# Patient Record
Sex: Male | Born: 2000
Health system: Southern US, Community
[De-identification: ages and names within clinical notes are randomized; demographics above are authoritative.]

## PROBLEM LIST (undated history)

## (undated) DIAGNOSIS — Z8701 Personal history of pneumonia (recurrent): Secondary | ICD-10-CM

## (undated) HISTORY — PX: EYE SURGERY: SHX253

---
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
## 2021-01-02 ENCOUNTER — Other Ambulatory Visit: Payer: Self-pay

## 2021-01-02 ENCOUNTER — Emergency Department (INDEPENDENT_AMBULATORY_CARE_PROVIDER_SITE_OTHER)
Admission: EM | Admit: 2021-01-02 | Discharge: 2021-01-02 | Disposition: A | Discharge status: Home / Self Care | Payer: BC Managed Care – PPO | Source: Home / Self Care | Attending: Family Medicine | Admitting: Family Medicine

## 2021-01-02 ENCOUNTER — Encounter: Payer: Self-pay | Admitting: *Deleted

## 2021-01-02 ENCOUNTER — Emergency Department: Admit: 2021-01-02 | Discharge status: Absent without leave | Payer: Self-pay

## 2021-01-02 DIAGNOSIS — J111 Influenza due to unidentified influenza virus with other respiratory manifestations: Secondary | ICD-10-CM | POA: Diagnosis not present

## 2021-01-02 DIAGNOSIS — R55 Syncope and collapse: Secondary | ICD-10-CM

## 2021-01-02 LAB — POCT INFLUENZA A/B
Influenza A, POC: POSITIVE — AB
Influenza B, POC: NEGATIVE

## 2021-01-02 LAB — POCT RAPID STREP A (OFFICE): Rapid Strep A Screen: NEGATIVE

## 2021-01-02 MED ORDER — OSELTAMIVIR PHOSPHATE 75 MG PO CAPS: 75.0000 mg | ORAL_CAPSULE | Freq: Two times a day (BID) | ORAL | 0 refills | Status: AC

## 2021-01-02 MED ORDER — ACETAMINOPHEN 325 MG PO TABS
650.0000 mg | ORAL_TABLET | Freq: Once | ORAL | Status: AC
  Administered 2021-01-02: 650 mg via ORAL

## 2021-01-02 MED ORDER — ONDANSETRON 4 MG PO TBDP
4.0000 mg | ORAL_TABLET | Freq: Once | ORAL | Status: AC
Start: 2021-01-02 — End: 2021-01-02
  Administered 2021-01-02: 4 mg via ORAL

## 2021-01-02 MED ORDER — ONDANSETRON HCL 8 MG PO TABS: 8.0000 mg | ORAL_TABLET | Freq: Three times a day (TID) | ORAL | 0 refills | Status: AC | PRN

## 2021-01-02 NOTE — Discharge Instructions (Signed)
Take Zofran 3 times a day as needed for nausea and vomiting.  It is important to drink lots of fluids Take Tamiflu 2 times a day for 5 days.  Take 2 doses today May take ibuprofen or Tylenol for pain and fever May take over-the-counter cough and cold medicines May return to activity when your symptoms have improved and you have no fever for 24 hours.  Recommend staying out until Monday

## 2021-01-02 NOTE — ED Provider Notes (Signed)
[facility]    CSN: [id number] Arrival date & time: 01/02/21  1040      History   Chief Complaint Chief Complaint  Patient presents with   Fatigue   Loss of Consciousness   Sore Throat    HPI Barry Martin is a 20 y.o. male.   HPI   Healthy 20 year old male who is brought in by his father for febrile illness.  He does have a his of eye disease.  Otherwise is in college and working. He has a sore throat.  Body aches.  Fever.  Weakness.  As father is registering him at the front desk he starts to feel faint.  He leans against father.  He is briefly unconscious from a syncopal episode.  Is placed in a wheelchair and brought back to our procedure room where he is put on the exam table and in Trendelenburg.  Vital signs are obtained.  He had a vasovagal episode.  He was quite pale.  Over the course of his visit, an hour, we did influenza and strep testing.  Influenza testing is positive.  He was given Zofran for nausea and vomiting.  He was given Tylenol for his fever.  He was given Gatorade to rehydrate.  When he left he was feeling much improved.  Past Medical History:  Diagnosis Date   History of pneumonia     There are no problems to display for this patient.   Past Surgical History:  Procedure Laterality Date   EYE SURGERY         Home Medications    Prior to Admission medications   Medication Sig Start Date End Date Taking? Authorizing Provider  ondansetron (ZOFRAN) 8 MG tablet Take 1 tablet (8 mg total) by mouth every 8 (eight) hours as needed for nausea or vomiting. 01/02/21  Yes Eustace Moore, MD  oseltamivir (TAMIFLU) 75 MG capsule Take 1 capsule (75 mg total) by mouth every 12 (twelve) hours. 01/02/21  Yes Eustace Moore, MD    Family History History reviewed. No pertinent family history.  Social History Social History   Tobacco Use   Smoking status: Never  Substance Use Topics   Alcohol use: No   Drug use: No      Allergies   Meloxicam   Review of Systems Review of Systems See HPI  Physical Exam Triage Vital Signs ED Triage Vitals  Enc Vitals Group     BP 01/02/21 1100 127/78     Pulse Rate 01/02/21 1100 (!) 111     Resp 01/02/21 1100 16     Temp 01/02/21 1100 (!) 101.5 F (38.6 C)     Temp Source 01/02/21 1100 Tympanic     SpO2 01/02/21 1100 94 %     Weight 01/02/21 1106 156 lb (70.8 kg)     Height --      Head Circumference --      Peak Flow --      Pain Score 01/02/21 1106 0     Pain Loc --      Pain Edu? --      Excl. in GC? --    No data found.  Updated Vital Signs BP 118/75 (BP Location: Right Arm)   Pulse 99   Temp 100 F (37.8 C) (Oral)   Resp 16   Wt 70.8 kg   SpO2 100%      Physical Exam Constitutional:      General: He is not in acute distress.  Appearance: He is well-developed. He is ill-appearing.  HENT:     Head: Normocephalic and atraumatic.     Right Ear: Tympanic membrane and ear canal normal.     Left Ear: Tympanic membrane and ear canal normal.     Nose: Congestion and rhinorrhea present.     Mouth/Throat:     Mouth: Mucous membranes are moist.     Pharynx: No posterior oropharyngeal erythema.  Eyes:     Conjunctiva/sclera: Conjunctivae normal.     Pupils: Pupils are equal, round, and reactive to light.  Cardiovascular:     Rate and Rhythm: Normal rate and regular rhythm.     Heart sounds: Normal heart sounds.  Pulmonary:     Effort: Pulmonary effort is normal. No respiratory distress.     Breath sounds: Normal breath sounds. No rhonchi.  Abdominal:     General: There is no distension.     Palpations: Abdomen is soft.  Musculoskeletal:        General: Normal range of motion.     Cervical back: Normal range of motion and neck supple.  Lymphadenopathy:     Cervical: No cervical adenopathy.  Skin:    General: Skin is warm and dry.     Comments: Initially quite pale.  Color returned after Trendelenburg position and hydration   Neurological:     General: No focal deficit present.     Mental Status: He is alert.  Psychiatric:        Mood and Affect: Mood normal.        Behavior: Behavior normal.     UC Treatments / Results  Labs (all labs ordered are listed, but only abnormal results are displayed) Labs Reviewed  POCT INFLUENZA A/B - Abnormal; Notable for the following components:      Result Value   Influenza A, POC Positive (*)    All other components within normal limits  POCT RAPID STREP A (OFFICE)    EKG   Radiology No results found.  Procedures Procedures (including critical care time)  Medications Ordered in UC Medications  ondansetron (ZOFRAN-ODT) disintegrating tablet 4 mg (4 mg Oral Given 01/02/21 1100)  acetaminophen (TYLENOL) tablet 650 mg (650 mg Oral Given 01/02/21 1104)    Initial Impression / Assessment and Plan / UC Course  I have reviewed the triage vital signs and the nursing notes.  Pertinent labs & imaging results that were available during my care of the patient were reviewed by me and considered in my medical decision making (see chart for details).     Influenza discussed.  Strep test is negative. Final Clinical Impressions(s) / UC Diagnoses   Final diagnoses:  Influenza  Syncope and collapse     Discharge Instructions      Take Zofran 3 times a day as needed for nausea and vomiting.  It is important to drink lots of fluids Take Tamiflu 2 times a day for 5 days.  Take 2 doses today May take ibuprofen or Tylenol for pain and fever May take over-the-counter cough and cold medicines May return to activity when your symptoms have improved and you have no fever for 24 hours.  Recommend staying out until Monday   ED Prescriptions     Medication Sig Dispense Auth. Provider   oseltamivir (TAMIFLU) 75 MG capsule Take 1 capsule (75 mg total) by mouth every 12 (twelve) hours. 10 capsule Eustace Moore, MD   ondansetron (ZOFRAN) 8 MG tablet Take 1 tablet (8  mg total)  by mouth every 8 (eight) hours as needed for nausea or vomiting. 20 tablet Eustace Moore, MD      PDMP not reviewed this encounter.   Eustace Moore, MD 01/02/21 2049

## 2021-01-02 NOTE — ED Notes (Signed)
At time of discharge patient reports feeling much better. Oriented and alert. Denies pain or nausea. Discharged home with his father.

## 2021-01-02 NOTE — ED Triage Notes (Signed)
Patient's father reports sore throat, body aches and HA started yesterday. This AM when he work him up he was lethargic, sweaty and slow to respond. Once inside the urgent care he had a couple second loss of consciousness. Shortly after he vomited. Once vomiting he became much more alert and reported he felt much better. Sweating has stopped, HA resolved and oriented x 4.

## 2023-06-12 ENCOUNTER — Encounter: Payer: Self-pay | Admitting: Emergency Medicine

## 2023-06-12 ENCOUNTER — Ambulatory Visit
Admission: EM | Admit: 2023-06-12 | Discharge: 2023-06-12 | Disposition: A | Discharge status: Home / Self Care | Attending: Family Medicine | Admitting: Family Medicine

## 2023-06-12 ENCOUNTER — Other Ambulatory Visit: Payer: Self-pay

## 2023-06-12 DIAGNOSIS — J019 Acute sinusitis, unspecified: Secondary | ICD-10-CM | POA: Diagnosis not present

## 2023-06-12 MED ORDER — AMOXICILLIN-POT CLAVULANATE 875-125 MG PO TABS: 1.0000 | ORAL_TABLET | Freq: Two times a day (BID) | ORAL | 0 refills | Status: AC

## 2023-06-12 NOTE — Discharge Instructions (Signed)
 Over the counter pseudoephedrine and fluticasone nasal spray as directed on the package for congestion Follow-up with the primary care doctor if your symptoms fail to improve or you develop new symptoms

## 2023-06-12 NOTE — ED Triage Notes (Signed)
 Pt c/o nasal congestion and cough sxs x 1 month. Pt states he has not been able to seek medical attention due to no ins coverage. Pt states he took leftover amoxicillin  from a friend about a week ago. Pt reports sxs did not change.

## 2023-06-12 NOTE — ED Provider Notes (Signed)
 [facility]    CSN: [id number] Arrival date & time: 06/12/23  1134      History   Chief Complaint Chief Complaint  Patient presents with   Nasal Congestion   Cough    HPI [facility] is a 23 y.o. male.   The history is provided by the patient.  Cough Associated symptoms: ear pain, headaches, rhinorrhea and sore throat   Associated symptoms: no chest pain, no chills, no diaphoresis, no rash and no shortness of breath   Been sick for 1 month states initially thought he had an upper respiratory infection symptoms included rhinorrhea, nasal congestion, scratchy throat and cough.  His symptoms have persisted for more than 3 weeks.  Admits headache, postnasal drip, cough.  Denies fever, chills, sweats, body aches, fatigue.  Took leftover amoxicillin  without relief.  Nasal drainage has been yellow, has had a lot of sinus pressure, ears feel full.  Denies, chills, sweats, body aches, fatigue, nausea, vomiting, diarrhea.  Allergy to meloxicam .  Smokes marijuana on the weekends does not smoke cigarettes or use tobacco products.  Denies recent travel or known contacts with illness.  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 Authorizing Provider  Acetaminophen  500 MG capsule Take 500 mg by mouth. 06/21/20  Yes [provider]  amoxicillin -clavulanate (AUGMENTIN ) 875-125 MG tablet Take 1 tablet by mouth every 12 (twelve) hours. 06/12/23  Yes Callee Rohrig, PA  famotidine (PEPCID) 10 MG tablet Take 10 mg by mouth. 04/07/18  Yes [provider]  loratadine (CLARITIN REDITABS) 10 MG dissolvable tablet Take 10 mg by mouth. 04/07/18  Yes [provider]  methocarbamol (ROBAXIN) 500 MG tablet Take 500 mg by mouth. 06/21/20  Yes [provider]  ofloxacin (OCUFLOX) 0.3 % ophthalmic solution Apply 1 drop to eye. 12/31/18  Yes [provider]  prednisoLONE acetate (PRED FORTE) 1 % ophthalmic suspension Apply 1 drop to eye. 12/31/18  Yes [provider]  ondansetron  (ZOFRAN ) 8 MG tablet Take 1 tablet (8 mg total) by mouth every 8 (eight) hours as needed for nausea or vomiting. 01/02/21   Stephany Ehrich, MD  oseltamivir  (TAMIFLU ) 75 MG capsule Take 1 capsule (75 mg total) by mouth every 12 (twelve) hours. 01/02/21   Stephany Ehrich, MD    Family History History reviewed. No pertinent family history.  Social History Social History   Tobacco Use   Smoking status: Never    Passive exposure: Never   Smokeless tobacco: Never  Vaping Use   Vaping status: Never Used  Substance Use Topics   Alcohol use: No   Drug use: No     Allergies   Meloxicam    Review of Systems Review of Systems  Constitutional:  Negative for appetite change, chills, diaphoresis and fatigue.  HENT:  Positive for congestion, ear pain, postnasal drip, rhinorrhea, sinus pressure, sinus pain and sore throat. Negative for ear discharge, trouble swallowing and voice change.   Respiratory:  Positive for  cough. Negative for shortness of breath.   Cardiovascular:  Negative for chest pain.  Gastrointestinal:  Negative for abdominal pain, diarrhea, nausea and vomiting.  Skin:  Negative for rash.  Neurological:  Positive for headaches. Negative for dizziness.     Physical Exam Triage Vital Signs ED Triage Vitals  Encounter Vitals Group     BP 06/12/23 1148 (!) 145/84     Systolic BP Percentile --      Diastolic BP Percentile --      Pulse Rate 06/12/23 1148 64     Resp 06/12/23 1148 16     Temp 06/12/23  1148 97.9 F (36.6 C)     Temp Source 06/12/23 1148 Oral     SpO2 06/12/23 1148 98 %     Weight 06/12/23 1144 156 lb 1.4 oz (70.8 kg)     Height --      Head Circumference --      Peak Flow --      Pain Score 06/12/23 1144 3     Pain Loc --      Pain Education --      Exclude from Growth Chart --    No data found.  Updated Vital Signs BP (!) 145/84 (BP Location: Right Arm)   Pulse 64   Temp 97.9 F (36.6 C) (Oral)   Resp 16   Wt 156 lb 1.4 oz (70.8 kg)   SpO2 98%   Visual Acuity Right Eye Distance:   Left Eye Distance:   Bilateral Distance:    Right Eye Near:   Left Eye Near:    Bilateral Near:     Physical Exam Vitals and nursing note reviewed.  Constitutional:      Appearance: He is not ill-appearing.  HENT:     Head: Normocephalic and atraumatic.     Right Ear: Tympanic membrane and ear canal normal.     Left Ear: Tympanic membrane and ear canal normal.     Nose: Congestion present. No rhinorrhea.     Mouth/Throat:     Mouth: Mucous membranes are moist.     Pharynx: Oropharynx is clear. No oropharyngeal exudate or posterior oropharyngeal erythema.     Comments: Normal swallowing, clear voice Eyes:     Conjunctiva/sclera: Conjunctivae normal.  Cardiovascular:     Rate and Rhythm: Normal rate and regular rhythm.  Pulmonary:     Effort: Pulmonary effort is normal. No respiratory distress.     Breath sounds: Normal breath sounds. No wheezing, rhonchi or rales.  Musculoskeletal:     Cervical back: Normal range of motion.  Skin:    General: Skin is warm and dry.  Neurological:     Mental Status: He is alert and oriented to person, place, and time.      UC Treatments / Results  Labs (all labs ordered are listed, but only abnormal results are displayed) Labs Reviewed - No data to display  EKG   Radiology No results found.  Procedures Procedures (including critical care time)  Medications Ordered in UC Medications - No data to display  Initial  Impression / Assessment and Plan / UC Course  I have reviewed the triage vital signs and the nursing notes.  Pertinent labs & imaging results that were available during my care of the patient were reviewed by me and considered in my medical decision making (see chart for details).     23 year old male with sinus congestion rhinorrhea and cough for 1 month, no fever.  Reports  sinus pain and pressure has sinus tenderness on exam.  Will treat with a round of Augmentin  recommend he use over-the-counter Flonase and Sudafed as directed on the package, warning signs and follow-up reviewed with patient follow-up as needed Final Clinical Impressions(s) / UC Diagnoses   Final diagnoses:  Acute sinusitis, recurrence not specified, unspecified location     Discharge Instructions      Over the counter pseudoephedrine and fluticasone nasal spray as directed on the package for congestion Follow-up with the primary care doctor if your symptoms fail to improve or you develop new symptoms    ED Prescriptions     Medication Sig Dispense Auth. Provider   amoxicillin -clavulanate (AUGMENTIN ) 875-125 MG tablet Take 1 tablet by mouth every 12 (twelve) hours. 14 tablet Shelaine Frie, [place]      PDMP not reviewed this encounter.   Saim Almanza, [place] 06/12/23 1206
